# Patient Record
Sex: Male | Born: 1960 | Race: White | Hispanic: No | Smoking: Never smoker
Health system: Southern US, Community
[De-identification: ages and names within clinical notes are randomized; demographics above are authoritative.]

## PROBLEM LIST (undated history)

## (undated) DIAGNOSIS — K759 Inflammatory liver disease, unspecified: Secondary | ICD-10-CM

---

## 2014-06-28 ENCOUNTER — Emergency Department (HOSPITAL_COMMUNITY): Payer: Medicare Other

## 2014-06-28 ENCOUNTER — Encounter (HOSPITAL_COMMUNITY): Payer: Self-pay | Admitting: Emergency Medicine

## 2014-06-28 ENCOUNTER — Emergency Department (HOSPITAL_COMMUNITY)
Admission: EM | Admit: 2014-06-28 | Discharge: 2014-06-29 | Disposition: A | Discharge status: Home / Self Care | Payer: Medicare Other | Attending: Emergency Medicine | Admitting: Emergency Medicine

## 2014-06-28 DIAGNOSIS — K7030 Alcoholic cirrhosis of liver without ascites: Secondary | ICD-10-CM

## 2014-06-28 DIAGNOSIS — F1010 Alcohol abuse, uncomplicated: Secondary | ICD-10-CM | POA: Diagnosis not present

## 2014-06-28 DIAGNOSIS — S40812A Abrasion of left upper arm, initial encounter: Secondary | ICD-10-CM

## 2014-06-28 DIAGNOSIS — S42123A Displaced fracture of acromial process, unspecified shoulder, initial encounter for closed fracture: Secondary | ICD-10-CM | POA: Diagnosis not present

## 2014-06-28 DIAGNOSIS — S27329A Contusion of lung, unspecified, initial encounter: Secondary | ICD-10-CM

## 2014-06-28 DIAGNOSIS — S46909A Unspecified injury of unspecified muscle, fascia and tendon at shoulder and upper arm level, unspecified arm, initial encounter: Secondary | ICD-10-CM | POA: Diagnosis not present

## 2014-06-28 DIAGNOSIS — Z23 Encounter for immunization: Secondary | ICD-10-CM | POA: Diagnosis not present

## 2014-06-28 DIAGNOSIS — S2243XA Multiple fractures of ribs, bilateral, initial encounter for closed fracture: Secondary | ICD-10-CM

## 2014-06-28 DIAGNOSIS — Z8619 Personal history of other infectious and parasitic diseases: Secondary | ICD-10-CM | POA: Diagnosis not present

## 2014-06-28 DIAGNOSIS — Y92410 Unspecified street and highway as the place of occurrence of the external cause: Secondary | ICD-10-CM | POA: Diagnosis not present

## 2014-06-28 DIAGNOSIS — S2249XA Multiple fractures of ribs, unspecified side, initial encounter for closed fracture: Secondary | ICD-10-CM | POA: Insufficient documentation

## 2014-06-28 DIAGNOSIS — R404 Transient alteration of awareness: Secondary | ICD-10-CM | POA: Diagnosis not present

## 2014-06-28 DIAGNOSIS — S3981XA Other specified injuries of abdomen, initial encounter: Secondary | ICD-10-CM | POA: Diagnosis not present

## 2014-06-28 DIAGNOSIS — S199XXA Unspecified injury of neck, initial encounter: Secondary | ICD-10-CM

## 2014-06-28 DIAGNOSIS — S298XXA Other specified injuries of thorax, initial encounter: Secondary | ICD-10-CM | POA: Diagnosis present

## 2014-06-28 DIAGNOSIS — Y9389 Activity, other specified: Secondary | ICD-10-CM | POA: Diagnosis not present

## 2014-06-28 DIAGNOSIS — J988 Other specified respiratory disorders: Secondary | ICD-10-CM | POA: Insufficient documentation

## 2014-06-28 DIAGNOSIS — IMO0002 Reserved for concepts with insufficient information to code with codable children: Secondary | ICD-10-CM | POA: Diagnosis not present

## 2014-06-28 DIAGNOSIS — S0993XA Unspecified injury of face, initial encounter: Secondary | ICD-10-CM | POA: Diagnosis not present

## 2014-06-28 DIAGNOSIS — S4980XA Other specified injuries of shoulder and upper arm, unspecified arm, initial encounter: Secondary | ICD-10-CM | POA: Insufficient documentation

## 2014-06-28 DIAGNOSIS — S40019A Contusion of unspecified shoulder, initial encounter: Secondary | ICD-10-CM | POA: Diagnosis not present

## 2014-06-28 DIAGNOSIS — S42121A Displaced fracture of acromial process, right shoulder, initial encounter for closed fracture: Secondary | ICD-10-CM

## 2014-06-28 DIAGNOSIS — S20229A Contusion of unspecified back wall of thorax, initial encounter: Secondary | ICD-10-CM | POA: Insufficient documentation

## 2014-06-28 HISTORY — DX: Inflammatory liver disease, unspecified: K75.9

## 2014-06-28 LAB — COMPREHENSIVE METABOLIC PANEL
ALT: 115 U/L — AB (ref 0–53)
AST: 138 U/L — ABNORMAL HIGH (ref 0–37)
Albumin: 3 g/dL — ABNORMAL LOW (ref 3.5–5.2)
Alkaline Phosphatase: 110 U/L (ref 39–117)
Anion gap: 12 (ref 5–15)
BUN: 7 mg/dL (ref 6–23)
CALCIUM: 7.7 mg/dL — AB (ref 8.4–10.5)
CHLORIDE: 105 meq/L (ref 96–112)
CO2: 24 meq/L (ref 19–32)
CREATININE: 0.69 mg/dL (ref 0.50–1.35)
GLUCOSE: 142 mg/dL — AB (ref 70–99)
Potassium: 4 mEq/L (ref 3.7–5.3)
Sodium: 141 mEq/L (ref 137–147)
Total Bilirubin: 0.8 mg/dL (ref 0.3–1.2)
Total Protein: 7.2 g/dL (ref 6.0–8.3)

## 2014-06-28 LAB — LIPASE, BLOOD: LIPASE: 49 U/L (ref 11–59)

## 2014-06-28 LAB — CBC
HCT: 39.1 % (ref 39.0–52.0)
Hemoglobin: 13.5 g/dL (ref 13.0–17.0)
MCH: 32.4 pg (ref 26.0–34.0)
MCHC: 34.5 g/dL (ref 30.0–36.0)
MCV: 93.8 fL (ref 78.0–100.0)
Platelets: 78 10*3/uL — ABNORMAL LOW (ref 150–400)
RBC: 4.17 MIL/uL — ABNORMAL LOW (ref 4.22–5.81)
RDW: 14 % (ref 11.5–15.5)
WBC: 6.3 10*3/uL (ref 4.0–10.5)

## 2014-06-28 LAB — ETHANOL: Alcohol, Ethyl (B): 249 mg/dL — ABNORMAL HIGH (ref 0–11)

## 2014-06-28 MED ORDER — IOHEXOL 300 MG/ML  SOLN
100.0000 mL | Freq: Once | INTRAMUSCULAR | Status: AC | PRN
  Administered 2014-06-28: 100 mL via INTRAVENOUS

## 2014-06-28 MED ORDER — TETANUS-DIPHTH-ACELL PERTUSSIS 5-2.5-18.5 LF-MCG/0.5 IM SUSP
0.5000 mL | Freq: Once | INTRAMUSCULAR | Status: AC
  Administered 2014-06-28: 0.5 mL via INTRAMUSCULAR
  Filled 2014-06-28: qty 0.5

## 2014-06-28 MED ORDER — SODIUM CHLORIDE 0.9 % IV BOLUS (SEPSIS)
1000.0000 mL | Freq: Once | INTRAVENOUS | Status: AC
  Administered 2014-06-28: 1000 mL via INTRAVENOUS

## 2014-06-28 NOTE — ED Notes (Addendum)
Abrasions to R shoulder and R lower back; abrasion to R knee. Large abrasion noted to L elbow. Pt c/o thoracic tenderness; denies cervical pain; tenderness to RLQ; tenderness to R ribs. Reports difficulty taking a deep breath and feeling short of breath. O2 sats 90% on RA; Pt placed on 99% on 2L via  [place]. Reports ETOH on board

## 2014-06-28 NOTE — ED Notes (Signed)
Mercedes, PA at bedside. 
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
## 2014-06-28 NOTE — ED Notes (Addendum)
Pt refusing chest xray unless pain medication is given. PA aware of situation; verbal orders to cancel chest xray, tylenol to be ordered for pain if needed
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
## 2014-06-29 LAB — RAPID URINE DRUG SCREEN, HOSP PERFORMED
Amphetamines: NOT DETECTED
Barbiturates: NOT DETECTED
Benzodiazepines: NOT DETECTED
Cocaine: NOT DETECTED
Opiates: NOT DETECTED
TETRAHYDROCANNABINOL: NOT DETECTED

## 2014-06-29 MED ORDER — DICLOFENAC SODIUM 1 % TD GEL: 4.0000 g | Freq: Four times a day (QID) | TRANSDERMAL | Status: AC

## 2014-06-29 MED ORDER — OXYCODONE-ACETAMINOPHEN 5-325 MG PO TABS: 1.0000 | ORAL_TABLET | Freq: Four times a day (QID) | ORAL | Status: AC | PRN

## 2014-06-29 MED ORDER — HYDROMORPHONE HCL PF 1 MG/ML IJ SOLN
1.0000 mg | Freq: Once | INTRAMUSCULAR | Status: AC
  Administered 2014-06-29: 1 mg via INTRAMUSCULAR
  Filled 2014-06-29: qty 1

## 2014-06-29 NOTE — ED Notes (Signed)
RT at bedside with incentive spirometer

## 2014-06-29 NOTE — ED Notes (Addendum)
Pt assisted to side of bed. Reports pain to shoulder and rib cage. Pt able to stand without difficulty

## 2014-06-29 NOTE — ED Notes (Signed)
Pt c/o R shoulder pain after returning from CT; full movement to R arm noted.

## 2014-06-29 NOTE — ED Notes (Signed)
Mercedes, PA at bedside. 

## 2014-06-29 NOTE — ED Notes (Signed)
Attempted to get pt up to wheelchair; pt sitting on side of bed, refusing to leave. [place] "[place] [facility] [place], [place] can't [facility] this. I can't go home like this. It hurts too bad." [place]cedes PA informed

## 2014-06-29 NOTE — ED Notes (Signed)
Pt reporting "my shoulder is catching, something isn't right." PA aware

## 2014-06-29 NOTE — Discharge Instructions (Signed)
Use the shoulder immobilizer at all times until you see the orthopedic doctor, whom you will need to call tomorrow for an appointment as soon as possible. Use ice on your ribs and shoulder to help with pain. Use percocet SPARINGLY, only for extreme pain, and do not take any additional tylenol or ibuprofen. Use voltaren gel over painful areas as directed, for pain. Keep your scrapes clean and dry, bandage them during the day and let them stay open to air at night. Wash with warm soapy water twice daily. Use the incentive spirometer every 4 hours while you're awake, taking deep breaths. Take a deep breath and cough every 4 hours, after using the spirometer. It is important that you keep your primary care appointment for October to have evaluation of your liver issues, because your CT showed a lymph node that is concerning for cancer. YOU NEED TO STOP DRINKING ALCOHOL because this is making your liver sicker. Return to the emergency department for any changes or worsening symptoms.   Shoulder Fracture You have a fractured humerus (bone in the upper arm) at the shoulder just below the ball of the shoulder joint. Most of the time the bones of a broken shoulder are in an acceptable position. Usually the injury can be treated with a shoulder immobilizer or sling and swath bandage. These devices support the arm and prevent any shoulder movement. If the bones are not in a good position, then surgery is sometimes needed. Shoulder fractures usually cause swelling, pain, and discoloration around the upper arm initially. They heal in 8-12 weeks with proper treatment. Rest in bed or a reclining chair as long as your shoulder is very painful. Sitting up generally results in less pain at the fracture site. Do not remove your shoulder bandage until your caregiver approves. You may apply ice packs over the shoulder for 20-30 minutes every 2 hours for the next 2-3 days to reduce the pain and swelling. Use your pain medicine as  prescribed.  SEEK IMMEDIATE MEDICAL CARE IF:  You develop severe shoulder pain unrelieved by rest and taking pain medicine.  You have pain, numbness, tingling, or weakness in the hand or wrist.  You develop shortness of breath, chest pain, severe weakness, or fainting.  You have severe pain with motion of the fingers or wrist. MAKE SURE YOU:   Understand these instructions.  Will watch your condition.  Will get help right away if you are not doing well or get worse. Document Released: 12/17/2004 Document Revised: 02/01/2012 Document Reviewed: 02/27/2009 [facility] Patient Information 2015 [place], [place]. This information is not intended to replace advice given to you by your health care provider. Make sure you discuss any questions you have with your health care provider.  Rib Fracture A rib fracture is a break or crack in one of the bones of the ribs. The ribs are like a cage that goes around your upper chest. A broken or cracked rib is often painful, but most do not cause other problems. Most rib fractures heal on their own in 1-3 months. HOME CARE  Avoid activities that cause pain to the injured area. Protect your injured area.  Slowly increase activity as told by your doctor.  Take medicine as told by your doctor.  Put ice on the injured area for the first 1-2 days after you have been treated or as told by your doctor.  Put ice in a plastic bag.  Place a towel between your skin and the bag.  Leave the  ice on for 15-20 minutes at a time, every 2 hours while you are awake.  Do deep breathing as told by your doctor. You may be told to:  Take deep breaths many times a day.  Cough many times a day while hugging a pillow.  Use a device (incentive spirometer) to perform deep breathing many times a day.  Drink enough fluids to keep your pee (urine) clear or pale yellow.   Do not wear a rib belt or binder. These do not allow you to breathe deeply. GET HELP RIGHT AWAY IF:     You have a fever.  You have trouble breathing.   You cannot stop coughing.  You cough up thick or bloody spit (mucus).   You feel sick to your stomach (nauseous), throw up (vomit), or have belly (abdominal) pain.   Your pain gets worse and medicine does not help.  MAKE SURE YOU:   Understand these instructions.  Will watch your condition.  Will get help right away if you are not doing well or get worse. Document Released: 08/18/2008 Document Revised: 03/06/2013 Document Reviewed: 01/11/2013 [facility] Patient Information 2015 [place], [place]. This information is not intended to replace advice given to you by your health care provider. Make sure you discuss any questions you have with your health care provider.  Wound Care Wound care helps prevent pain and infection.  You may need a tetanus shot if:  You cannot remember when you had your last tetanus shot.  You have never had a tetanus shot.  The injury broke your skin. If you need a tetanus shot and you choose not to have one, you may get tetanus. Sickness from tetanus can be serious. HOME CARE   Only take medicine as told by your doctor.  Clean the wound daily with mild soap and water.  Change any bandages (dressings) as told by your doctor.  Put medicated cream and a bandage on the wound as told by your doctor.  Change the bandage if it gets wet, dirty, or starts to smell.  Take showers. Do not take baths, swim, or do anything that puts your wound under water.  Rest and raise (elevate) the wound until the pain and puffiness (swelling) are better.  Keep all doctor visits as told. GET HELP RIGHT AWAY IF:   Yellowish-white fluid (pus) comes from the wound.  Medicine does not lessen your pain.  There is a red streak going away from the wound.  You have a fever. MAKE SURE YOU:   Understand these instructions.  Will watch your condition.  Will get help right away if you are not doing well or get  worse. Document Released: 08/18/2008 Document Revised: 02/01/2012 Document Reviewed: 03/15/2011 [facility] Patient Information 2015 [place], [place]. This information is not intended to replace advice given to you by your health care provider. Make sure you discuss any questions you have with your health care provider.  Pulmonary Contusion A pulmonary contusion is a deep bruise to the lung. The bruise causes the lung tissue to puff up (swell) and bleed into the surrounding area. The lungs will not work right if this happens. You may find it hard to breathe because you are not getting enough oxygen.  HOME CARE  Only take medicine as told by your doctor. Do not take aspirin for the first few days.  Do your deep breathing exercises as told.  Return to normal activities when your doctor says it is okay. GET HELP RIGHT AWAY IF:  You have trouble breathing and it gets worse.  Your chest pain gets worse.  You cough up blood.  You make high-pitched whistling sounds when you breathe (wheeze).  Your lips or nails look blue.  You have a fever.  You feel dizzy, confused, or like you will pass out (faint). MAKE SURE YOU:   Understand these instructions.  Will watch your condition.  Will get help right away if you are not doing well or get worse. Document Released: 10/29/2011 Document Revised: 02/01/2012 Document Reviewed: 10/29/2011 [facility] Patient Information 2015 [place], [place]. This information is not intended to replace advice given to you by your health care provider. Make sure you discuss any questions you have with your health care provider.  Alcoholic Liver Disease Alcoholic liver disease means you have a damaged liver that does not work properly. This disease is caused by drinking too much alcohol. Some people do not have any problems until the disease has become very bad. Problems can be worse after a period of heavy drinking. HOME CARE  Stop drinking alcohol.  Get expert  advice or help (counseling) to quit drinking. Join an alchohol support group.  You may need to eat foods that give you energy (carbohydrates), such as:  Milk and yogurt.  Navy, pinto, and white beans.  Applesauce, grapes, dried dates, prunes, and raisins.  Potatoes and rice.  Eat foods that are high in vitamins, especially thiamine and folic acid. These foods include:  Whole-wheat or whole-grain breads and cereals. Look on the package for "added thiamine" or "added folic acid."  Meat, especially pork.  Fresh, raw vegetables.  Fresh fruits and vegetables, such as oranges, orange juice, avocados, beets, and cantaloupe.  Dark green, leafy vegetables, such as romaine lettuce, spinach, and broccoli.  Beans and nuts.  Dairy foods, such as milk, butter, yogurt, cheese, and ice cream. GET HELP RIGHT AWAY IF:   You have bright red blood in your poop (stool).  You cough or throw up (vomit) blood.  Your skin and eyes turn more yellow.  You have a bad headache or problems thinking.  You have trouble walking. MAKE SURE YOU:   Understand these instructions.  Will watch your condition.  Will get help right away if you are not doing well or get worse. Document Released: 09/06/2009 Document Revised: 02/01/2012 Document Reviewed: 09/06/2009 [facility] Patient Information 2015 [place], [place]. This information is not intended to replace advice given to you by your health care provider. Make sure you discuss any questions you have with your health care provider.  Blunt Chest Trauma Blunt chest trauma is an injury caused by a blow to the chest. These chest injuries can be very painful. Blunt chest trauma often results in bruised or broken (fractured) ribs. Most cases of bruised and fractured ribs from blunt chest traumas get better after 1 to 3 weeks of rest and pain medicine. Often, the soft tissue in the chest wall is also injured, causing pain and bruising. Internal organs, such as the  heart and lungs, may also be injured. Blunt chest trauma can lead to serious medical problems. This injury requires immediate medical care. CAUSES   Motor vehicle collisions.  Falls.  Physical violence.  Sports injuries. SYMPTOMS   Chest pain. The pain may be worse when you move or breathe deeply.  Shortness of breath.  Lightheadedness.  Bruising.  Tenderness.  Swelling. DIAGNOSIS  Your caregiver will do a physical exam. X-rays may be taken to look for fractures. However, minor rib fractures  may not show up on X-rays until a few days after the injury. If a more serious injury is suspected, further imaging tests may be done. This may include ultrasounds, computed tomography (CT) scans, or magnetic resonance imaging (MRI). TREATMENT  Treatment depends on the severity of your injury. Your caregiver may prescribe pain medicines and deep breathing exercises. HOME CARE INSTRUCTIONS  Limit your activities until you can move around without much pain.  Do not do any strenuous work until your injury is healed.  Put ice on the injured area.  Put ice in a plastic bag.  Place a towel between your skin and the bag.  Leave the ice on for 15-20 minutes, 03-04 times a day.  You may wear a rib belt as directed by your caregiver to reduce pain.  Practice deep breathing as directed by your caregiver to keep your lungs clear.  Only take over-the-counter or prescription medicines for pain, fever, or discomfort as directed by your caregiver. SEEK IMMEDIATE MEDICAL CARE IF:   You have increasing pain or shortness of breath.  You cough up blood.  You have nausea, vomiting, or abdominal pain.  You have a fever.  You feel dizzy, weak, or you faint. MAKE SURE YOU:  Understand these instructions.  Will watch your condition.  Will get help right away if you are not doing well or get worse. Document Released: 12/17/2004 Document Revised: 02/01/2012 Document Reviewed:  08/26/2011 [facility] Patient Information 2015 [place], [place]. This information is not intended to replace advice given to you by your health care provider. Make sure you discuss any questions you have with your health care provider.

## 2014-06-29 NOTE — ED Notes (Signed)
Pt given urinal.

## 2014-06-29 NOTE — ED Notes (Signed)
PA at bedside.

## 2014-06-29 NOTE — ED Notes (Signed)
Ortho paged. 

## 2014-07-02 NOTE — ED Provider Notes (Signed)
Medical screening examination/treatment/procedure(s) were performed by non-physician practitioner and as supervising physician I was immediately available for consultation/collaboration.   EKG Interpretation None        Samuel JesterKathleen Jeff Mccallum, DO 07/02/14 [phone]
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
## 2023-07-25 DEATH — deceased
